# Patient Record
Sex: Female | Born: 1995 | Race: White | Hispanic: Yes | Marital: Single | Smoking: Never smoker
Health system: Southern US, Community
[De-identification: ages and names within clinical notes are randomized; demographics above are authoritative.]

## PROBLEM LIST (undated history)

## (undated) DIAGNOSIS — F32A Depression, unspecified: Secondary | ICD-10-CM

## (undated) DIAGNOSIS — F329 Major depressive disorder, single episode, unspecified: Secondary | ICD-10-CM

## (undated) DIAGNOSIS — T7840XA Allergy, unspecified, initial encounter: Secondary | ICD-10-CM

## (undated) HISTORY — PX: WISDOM TOOTH EXTRACTION: SHX21

---
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
## 2016-12-06 ENCOUNTER — Emergency Department
Admission: EM | Admit: 2016-12-06 | Discharge: 2016-12-06 | Disposition: A | Discharge status: Home / Self Care | Payer: 59 | Attending: Emergency Medicine | Admitting: Emergency Medicine

## 2016-12-06 ENCOUNTER — Emergency Department: Payer: 59

## 2016-12-06 DIAGNOSIS — Y999 Unspecified external cause status: Secondary | ICD-10-CM | POA: Insufficient documentation

## 2016-12-06 DIAGNOSIS — S93401A Sprain of unspecified ligament of right ankle, initial encounter: Secondary | ICD-10-CM | POA: Insufficient documentation

## 2016-12-06 DIAGNOSIS — W1839XA Other fall on same level, initial encounter: Secondary | ICD-10-CM | POA: Diagnosis not present

## 2016-12-06 DIAGNOSIS — S8991XA Unspecified injury of right lower leg, initial encounter: Secondary | ICD-10-CM | POA: Diagnosis present

## 2016-12-06 DIAGNOSIS — Y929 Unspecified place or not applicable: Secondary | ICD-10-CM | POA: Insufficient documentation

## 2016-12-06 DIAGNOSIS — Y939 Activity, unspecified: Secondary | ICD-10-CM | POA: Insufficient documentation

## 2016-12-06 DIAGNOSIS — M25571 Pain in right ankle and joints of right foot: Secondary | ICD-10-CM

## 2016-12-06 MED ORDER — HYDROCODONE-ACETAMINOPHEN 5-325 MG PO TABS: 1.0000 | ORAL_TABLET | ORAL | 0 refills | Status: AC | PRN

## 2016-12-06 MED ORDER — HYDROCODONE-ACETAMINOPHEN 5-325 MG PO TABS
ORAL_TABLET | ORAL | Status: AC
  Administered 2016-12-06: 1 via ORAL
  Filled 2016-12-06: qty 1

## 2016-12-06 MED ORDER — HYDROCODONE-ACETAMINOPHEN 5-325 MG PO TABS
1.0000 | ORAL_TABLET | Freq: Once | ORAL | Status: AC
  Administered 2016-12-06: 1 via ORAL

## 2016-12-06 NOTE — ED Provider Notes (Signed)
[facility] Emergency Department Provider Note   ____________________________________________    I have reviewed the triage vital signs and the nursing notes.   HISTORY  Chief Complaint Ankle Pain     HPI Betsy CoderSarah Maqueda is a 21 y.o. female who presents after a fall. Patient reports she suffered an ankle injury, she reports an eversion injury of the right ankle. She reports 7 out of 10 moderate pain primarily in the lateral aspect of the right ankle. She is not taking anything for it. No other injuries reported   History reviewed. No pertinent past medical history.  There are no active problems to display for this patient.   Past Surgical History:  Procedure Laterality Date  . WISDOM TOOTH EXTRACTION      Prior to Admission medications   Medication Sig Start Date End Date Taking? Authorizing Provider  HYDROcodone-acetaminophen (NORCO/VICODIN) 5-325 MG tablet Take 1 tablet by mouth every 4 (four) hours as needed for moderate pain. 12/06/16   Jene Everyobert Roby Spalla, MD     Allergies Patient has no known allergies.  Family History  Problem Relation Age of Onset  . Cancer Paternal Grandmother     Social History Social History  Substance Use Topics  . Smoking status: Never Smoker  . Smokeless tobacco: Never Used  . Alcohol use Yes     Comment: occassional    Review of Systems  Constitutional: NoDizziness  ENT: No neck pain Gastrointestinal: No abdominal pain.    Musculoskeletal: Negative for back pain. Skin: Negative for laceration Neurological: Negative for headaches     ____________________________________________   PHYSICAL EXAM:  VITAL SIGNS: ED Triage Vitals  Enc Vitals Group     BP 12/06/16 2137 122/84     Pulse Rate 12/06/16 2137 99     Resp 12/06/16 2137 16     Temp 12/06/16 2137 98.7 F (37.1 C)     Temp Source 12/06/16 2137 Oral     SpO2 12/06/16 2137 100 %     Weight 12/06/16 2138 167 lb (75.8 kg)     Height  12/06/16 2138 5\' 6"  (1.676 m)     Head Circumference --      Peak Flow --      Pain Score 12/06/16 2137 7     Pain Loc --      Pain Edu? --      Excl. in GC? --     Constitutional: Alert and oriented. No acute distress. Pleasant and interactive Eyes: Conjunctivae are normal.  Head: Atraumatic. Nose: No congestion/rhinnorhea. Mouth/Throat: Mucous membranes are moist.   Cardiovascular: Normal rate, regular rhythm.  Respiratory: Normal respiratory effort.  No retractions. Genitourinary: deferred Musculoskeletal: Patient with mild tenderness to palpation along the right lateral malleolus, no significant swelling. Painful range of motion. Achilles tendon is intact. No proximal fibula tenderness. 2+ distal pulses. No forefoot or midfoot tenderness Neurologic:  Normal speech and language. No gross focal neurologic deficits are appreciated.   Skin:  Skin is warm, dry and intact. No rash noted.   ____________________________________________   LABS (all labs ordered are listed, but only abnormal results are displayed)  Labs Reviewed - No data to display ____________________________________________  EKG   ____________________________________________  RADIOLOGY  X-ray negative for fracture ____________________________________________   PROCEDURES  Procedure(s) performed: yes  SPLINT APPLICATION Date/Time: 10:34 PM Authorized by: Jene EveryKINNER, Elizebath Wever Consent: Verbal consent obtained. Risks and benefits: risks, benefits and alternatives were discussed Consent given by: patient Splint applied by: me Location details:  right ankle Splint type: posterior Supplies used: orthoglass Post-procedure: The splinted body part was neurovascularly unchanged following the procedure. Patient tolerance: Patient tolerated the procedure well with no immediate complications.       Critical Care performed: No ____________________________________________   INITIAL IMPRESSION / ASSESSMENT AND  PLAN / ED COURSE  Pertinent labs & imaging results that were available during my care of the patient were reviewed by me and considered in my medical decision making (see chart for details).  Patient with right ankle sprain. Splinted, crutches provided, Vicodin in the ED. Recommend Tylenol and Motrin for pain   ____________________________________________   FINAL CLINICAL IMPRESSION(S) / ED DIAGNOSES  Final diagnoses:  Acute right ankle pain      NEW MEDICATIONS STARTED DURING THIS VISIT:  New Prescriptions   HYDROCODONE-ACETAMINOPHEN (NORCO/VICODIN) 5-325 MG TABLET    Take 1 tablet by mouth every 4 (four) hours as needed for moderate pain.     Note:  This document was prepared using Dragon voice recognition software and may include unintentional dictation errors.    Jene Every, MD 12/06/16 2234

## 2016-12-06 NOTE — ED Triage Notes (Signed)
Patient c/o right ankle pain following a fall

## 2016-12-10 ENCOUNTER — Ambulatory Visit: Payer: 59 | Admitting: Podiatry

## 2019-01-28 ENCOUNTER — Telehealth: Payer: Self-pay | Admitting: *Deleted

## 2019-01-28 DIAGNOSIS — Z20822 Contact with and (suspected) exposure to covid-19: Secondary | ICD-10-CM

## 2019-01-28 NOTE — Telephone Encounter (Signed)
Pt needs scheduled for covid testing.  Requested by [facility].  Retest after positive results.

## 2019-01-29 NOTE — Telephone Encounter (Signed)
Left voicemail for patient to return call to schedule COVID 19 test.  Test ordered. 

## 2019-02-07 IMAGING — CR DG ANKLE COMPLETE 3+V*R*
1 series · 3 of 3 positions shown · non-contrast
Comparison: None.

CLINICAL DATA: Persistent pain after twisting injury walking down
steps this evening.

EXAM:
RIGHT ANKLE - COMPLETE 3+ VIEW

[Series 1: dg ankle complete right · 0.14mm/px · 3 of 3 slices shown]
[im 1/3]
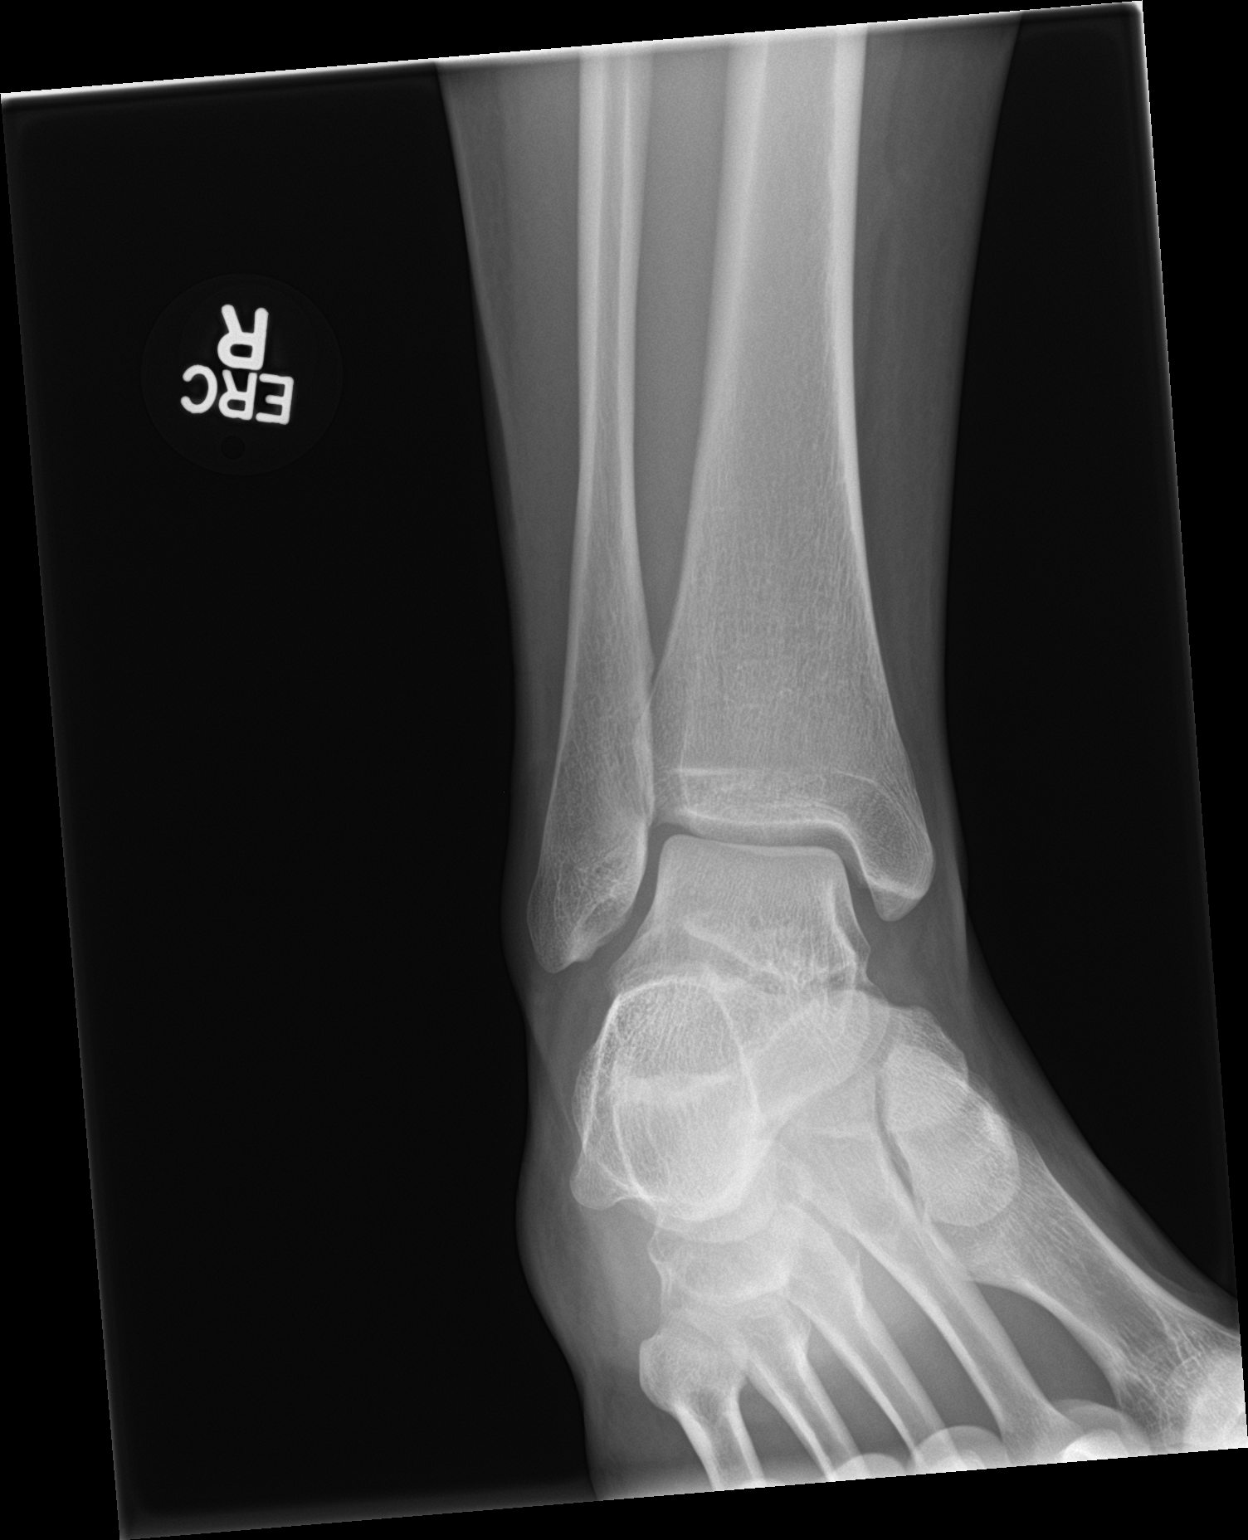
[im 2/3]
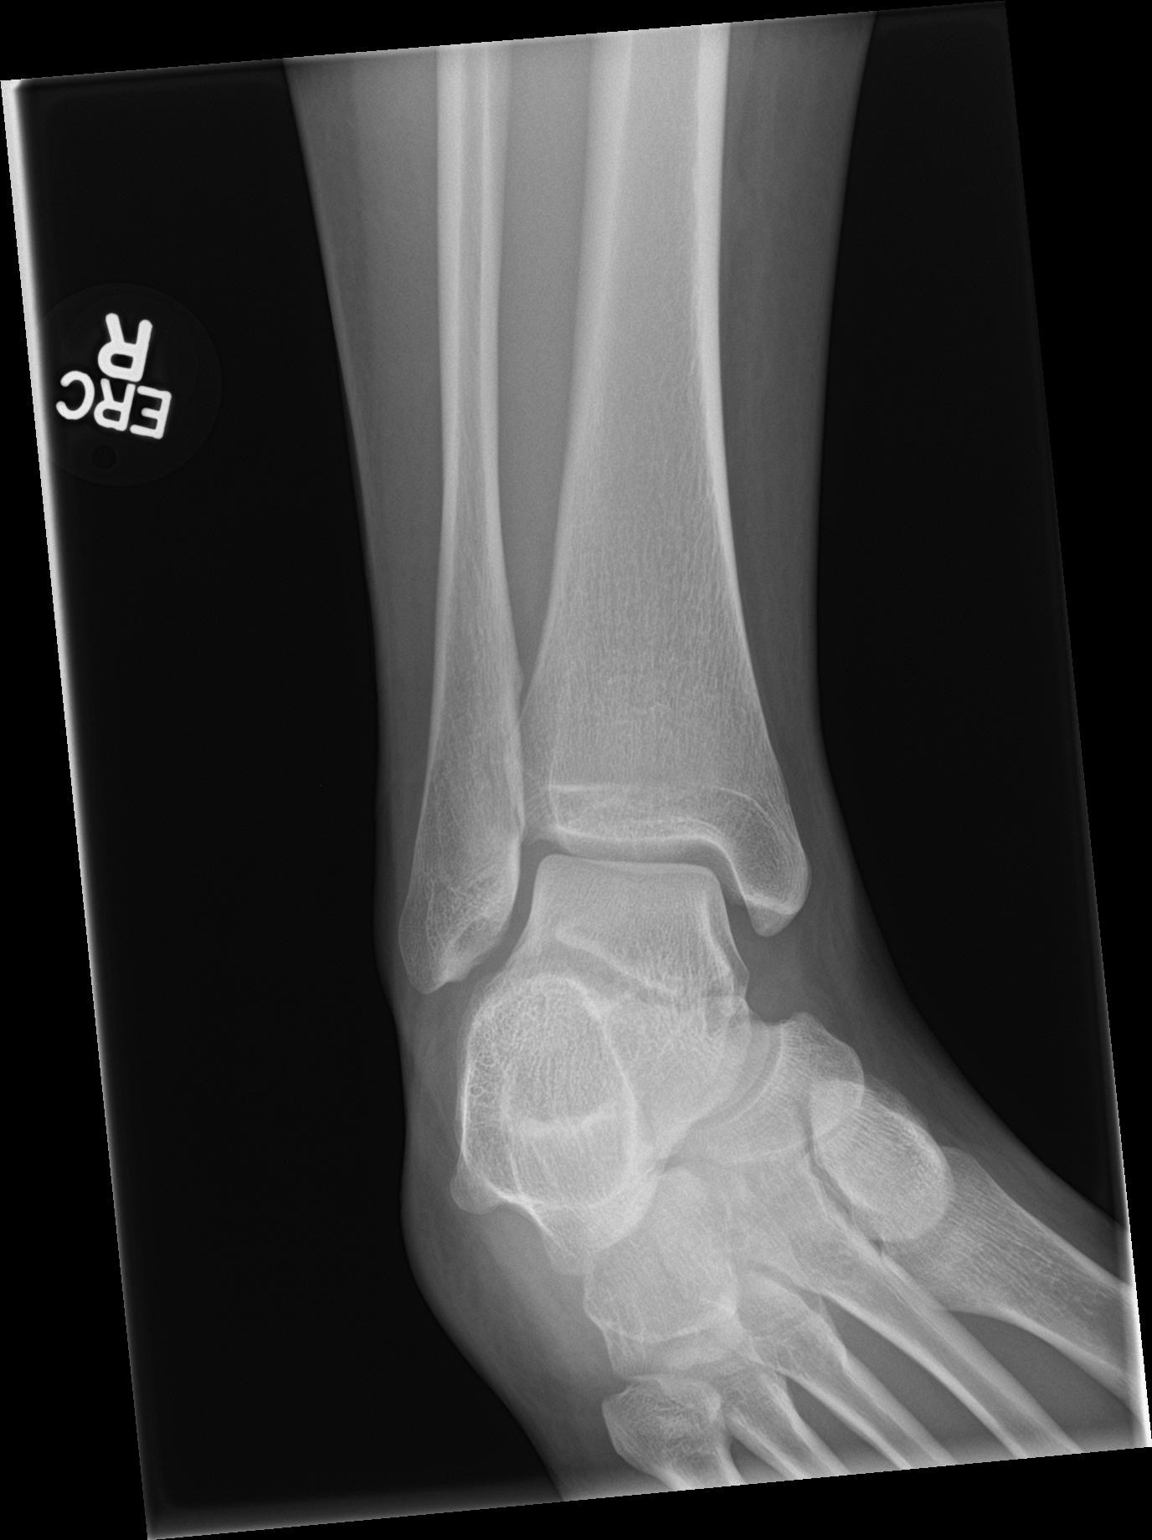
[im 3/3]
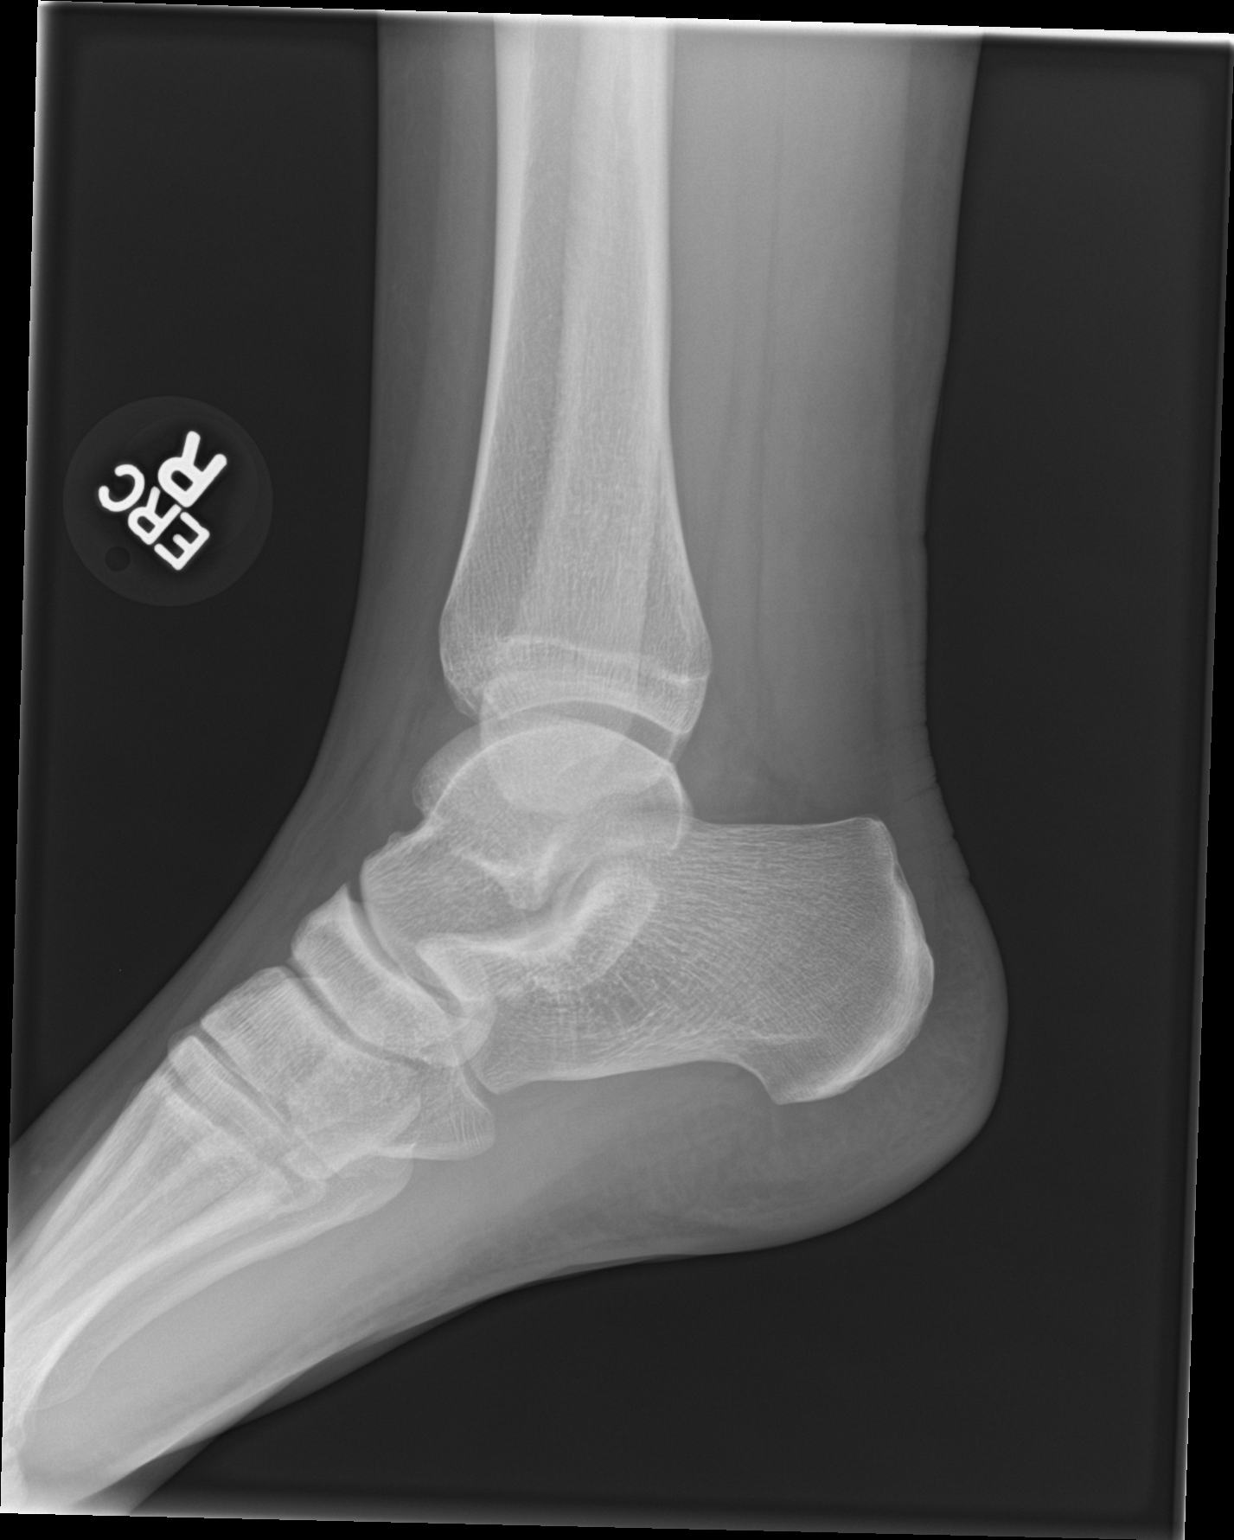

[3 of 3 positions shown; findings below may reference images not displayed]

FINDINGS: Negative for acute fracture. Mortise is symmetric. No acute soft
tissue abnormality. No bone lesion or bony destruction.
IMPRESSION: Negative.

## 2019-10-10 ENCOUNTER — Ambulatory Visit: Payer: 59 | Attending: Internal Medicine

## 2019-10-10 DIAGNOSIS — Z23 Encounter for immunization: Secondary | ICD-10-CM

## 2019-10-10 NOTE — Progress Notes (Signed)
   Covid-19 Vaccination Clinic  Name:  Amy Gardner    MRN: [id number] DOB: 03/25/1996  10/10/2019  [name] was observed post Covid-19 immunization for 15 minutes without incident. She was provided with Vaccine Information Sheet and instruction to access the V-Safe system.   [name] was instructed to call 911 with any severe reactions post vaccine: Marland Kitchen Difficulty breathing  . Swelling of face and throat  . A fast heartbeat  . A bad rash all over body  . Dizziness and weakness   Immunizations Administered    Name Date Dose VIS Date Route   Pfizer COVID-19 Vaccine 10/10/2019  3:48 PM 0.3 mL 07/16/2019 Intramuscular   Manufacturer: [employer], [employer]   Lot: [id number]   NDC: [address]-0

## 2019-11-03 ENCOUNTER — Ambulatory Visit: Payer: 59 | Attending: Internal Medicine

## 2019-11-03 DIAGNOSIS — Z23 Encounter for immunization: Secondary | ICD-10-CM

## 2019-11-03 NOTE — Progress Notes (Signed)
   Covid-19 Vaccination Clinic  Name:  Amy Gardner    MRN: [id number] DOB: 12-24-1995  11/03/2019  [name] was observed post Covid-19 immunization for 15 minutes without incident. She was provided with Vaccine Information Sheet and instruction to access the V-Safe system.   [name] was instructed to call 911 with any severe reactions post vaccine: Marland Kitchen Difficulty breathing  . Swelling of face and throat  . A fast heartbeat  . A bad rash all over body  . Dizziness and weakness   Immunizations Administered    Name Date Dose VIS Date Route   Pfizer COVID-19 Vaccine 11/03/2019 10:35 AM 0.3 mL 07/16/2019 Intramuscular   Manufacturer: [employer], [employer]   Lot: [id number]   NDC: [address]-8

## 2019-11-09 ENCOUNTER — Other Ambulatory Visit: Payer: Self-pay

## 2019-11-09 ENCOUNTER — Ambulatory Visit
Admission: EM | Admit: 2019-11-09 | Discharge: 2019-11-09 | Disposition: A | Discharge status: Home / Self Care | Payer: 59

## 2019-11-09 DIAGNOSIS — J302 Other seasonal allergic rhinitis: Secondary | ICD-10-CM | POA: Diagnosis not present

## 2019-11-09 HISTORY — DX: Depression, unspecified: F32.A

## 2019-11-09 HISTORY — DX: Allergy, unspecified, initial encounter: T78.40XA

## 2019-11-09 NOTE — Discharge Instructions (Addendum)
Take over-the-counter plain Mucinex and continue using your prescribed nasal spray.    Take ibuprofen as needed for discomfort.    Follow up with your primary care provider if your symptoms are not improving.

## 2019-11-09 NOTE — ED Triage Notes (Signed)
Pt got her 2nd COVID vaccine on Wed & is here with sinus pressure, nasal congestion & nasal drainage this started Friday.

## 2019-11-09 NOTE — ED Provider Notes (Signed)
Amy Gardner    CSN: [id number] Arrival date & time: 11/09/19  1129      History   Chief Complaint Chief Complaint  Patient presents with  . Sinus Problem    HPI Amy Gardner is a 24 y.o. female.   Patient presents with sinus pressure, nasal congestion, postnasal drip x4 days.  She states she had her second Covid vaccine on 11/03/2019.  She denies fever, chills, ear pain, sore throat, cough, shortness of breath, vomiting, diarrhea, rash, or other symptoms.  Treatment attempted at home with OTC sinus medication.  The history is provided by the patient.    Past Medical History:  Diagnosis Date  . Allergies   . Depression     There are no problems to display for this patient.   Past Surgical History:  Procedure Laterality Date  . WISDOM TOOTH EXTRACTION      OB History    Gravida  0   Para  0   Term  0   Preterm  0   AB  0   Living  0     SAB  0   TAB  0   Ectopic  0   Multiple  0   Live Births  0            Home Medications    Prior to Admission medications   Medication Sig Start Date End Date Taking? Authorizing Provider  clonazePAM (KLONOPIN) 1 MG tablet Take by mouth. 01/20/19  Yes [provider]  DULoxetine (CYMBALTA) 30 MG capsule Take by mouth. 05/20/19  Yes [provider]  DULoxetine (CYMBALTA) 60 MG capsule Take by mouth. 05/20/19  Yes [provider]  GuanFACINE HCl 3 MG TB24 Take by mouth. 08/24/19  Yes [provider]  Naltrexone-buPROPion HCl ER (CONTRAVE) 8-90 MG TB12 Take by mouth. 08/29/19  Yes [provider]  spironolactone (ALDACTONE) 50 MG tablet Take by mouth. 09/21/19  Yes [provider]  Azelastine-Fluticasone 137-50 MCG/ACT SUSP Place into the nose.    [provider]  clonazePAM (KLONOPIN) 0.5 MG tablet Take 0.5 mg by mouth at bedtime. 08/24/19   [provider]  CONTRAVE 8-90 MG TB12 Take 2 tablets by mouth 2 (two) times daily. 09/05/19    [provider]  DULoxetine (CYMBALTA) 30 MG capsule PLEASE SEE ATTACHED FOR DETAILED DIRECTIONS 05/20/19   [provider]  DULoxetine (CYMBALTA) 60 MG capsule Take 60 mg by mouth daily. 05/20/19   [provider]  fluconazole (DIFLUCAN) 150 MG tablet Take 150 mg by mouth once as needed. 05/25/19   [provider]  GuanFACINE HCl 3 MG TB24 Take 1 tablet by mouth at bedtime. 08/24/19   [provider]  HYDROcodone-acetaminophen (NORCO/VICODIN) 5-325 MG tablet Take 1 tablet by mouth every 4 (four) hours as needed for moderate pain. 12/06/16   Jene Every, MD  methocarbamol (ROBAXIN) 500 MG tablet  09/28/19   [provider]  [name] 3-0.02 MG tablet Take 1 tablet by mouth daily. 09/30/19   [provider]  spironolactone (ALDACTONE) 25 MG tablet Take 25 mg by mouth daily. 08/04/19   [provider]  spironolactone (ALDACTONE) 50 MG tablet Take 50 mg by mouth daily. 09/21/19   [provider]    Family History Family History  Problem Relation Age of Onset  . Cancer Paternal Grandmother   . Diabetes Mother   . Diabetes Father   . Hyperlipidemia Father     Social History Social  History   Tobacco Use  . Smoking status: Never Smoker  . Smokeless tobacco: Never Used  Substance Use Topics  . Alcohol use: Yes    Comment: occassional  . Drug use: No     Allergies   Patient has no known allergies.   Review of Systems Review of Systems  Constitutional: Negative for chills and fever.  HENT: Positive for congestion, postnasal drip and sinus pressure. Negative for ear pain and sore throat.   Eyes: Negative for pain and visual disturbance.  Respiratory: Negative for cough and shortness of breath.   Cardiovascular: Negative for chest pain and palpitations.  Gastrointestinal: Negative for abdominal pain, diarrhea, nausea and vomiting.  Genitourinary: Negative for dysuria and hematuria.  Musculoskeletal: Negative  for arthralgias and back pain.  Skin: Negative for color change and rash.  Neurological: Negative for seizures and syncope.  All other systems reviewed and are negative.    Physical Exam Triage Vital Signs ED Triage Vitals  Enc Vitals Group     BP 11/09/19 1138 108/70     Pulse Rate 11/09/19 1138 73     Resp 11/09/19 1138 19     Temp 11/09/19 1138 98 F (36.7 C)     Temp Source 11/09/19 1138 Oral     SpO2 11/09/19 1138 98 %     Weight 11/09/19 1132 220 lb (99.8 kg)     Height --      Head Circumference --      Peak Flow --      Pain Score 11/09/19 1131 6     Pain Loc --      Pain Edu? --      Excl. in GC? --    No data found.  Updated Vital Signs BP 108/70 (BP Location: Left Arm)   Pulse 73   Temp 98 F (36.7 C) (Oral)   Resp 19   Wt 220 lb (99.8 kg)   LMP 10/26/2019   SpO2 98%   BMI 35.51 kg/m   Visual Acuity Right Eye Distance:   Left Eye Distance:   Bilateral Distance:    Right Eye Near:   Left Eye Near:    Bilateral Near:     Physical Exam Vitals and nursing note reviewed.  Constitutional:      General: She is not in acute distress.    Appearance: She is well-developed. She is not ill-appearing.  HENT:     Head: Normocephalic and atraumatic.     Right Ear: Tympanic membrane normal.     Left Ear: Tympanic membrane normal.     Nose: Nose normal.     Mouth/Throat:     Mouth: Mucous membranes are moist.     Pharynx: Oropharynx is clear.  Eyes:     Conjunctiva/sclera: Conjunctivae normal.  Cardiovascular:     Rate and Rhythm: Normal rate and regular rhythm.     Heart sounds: No murmur.  Pulmonary:     Effort: Pulmonary effort is normal. No respiratory distress.     Breath sounds: Normal breath sounds.  Abdominal:     General: Bowel sounds are normal.     Palpations: Abdomen is soft.     Tenderness: There is no abdominal tenderness. There is no guarding or rebound.  Musculoskeletal:     Cervical back: Neck supple.  Skin:    General: Skin is  warm and dry.     Findings: No rash.  Neurological:     General: No focal deficit present.  Mental Status: She is alert and oriented to person, place, and time.  Psychiatric:        Mood and Affect: Mood normal.        Behavior: Behavior normal.      UC Treatments / Results  Labs (all labs ordered are listed, but only abnormal results are displayed) Labs Reviewed - No data to display  EKG   Radiology No results found.  Procedures Procedures (including critical care time)  Medications Ordered in UC Medications - No data to display  Initial Impression / Assessment and Plan / UC Course  I have reviewed the triage vital signs and the nursing notes.  Pertinent labs & imaging results that were available during my care of the patient were reviewed by me and considered in my medical decision making (see chart for details).   Seasonal allergies.  Treating with Mucinex and currently prescribed allergy nasal spray.  Instructed patient to take ibuprofen as needed for discomfort.  Instructed her to follow-up with her PCP if her symptoms of not improving.  Patient agrees to plan of care.     Final Clinical Impressions(s) / UC Diagnoses   Final diagnoses:  Seasonal allergies     Discharge Instructions     Take over-the-counter plain Mucinex and continue using your prescribed nasal spray.    Take ibuprofen as needed for discomfort.    Follow up with your primary care provider if your symptoms are not improving.        ED Prescriptions    None     PDMP not reviewed this encounter.   Sharion Balloon, NP 11/09/19 [phone]
